# Patient Record
Sex: Male | Born: 1935 | Race: White | Hispanic: No | Marital: Married | Smoking: Former smoker
Health system: Southern US, Community
[De-identification: ages and names within clinical notes are randomized; demographics above are authoritative.]

## PROBLEM LIST (undated history)

## (undated) DIAGNOSIS — E785 Hyperlipidemia, unspecified: Secondary | ICD-10-CM

## (undated) DIAGNOSIS — I10 Essential (primary) hypertension: Secondary | ICD-10-CM

## (undated) DIAGNOSIS — Z972 Presence of dental prosthetic device (complete) (partial): Secondary | ICD-10-CM

## (undated) DIAGNOSIS — E119 Type 2 diabetes mellitus without complications: Secondary | ICD-10-CM

## (undated) DIAGNOSIS — Z974 Presence of external hearing-aid: Secondary | ICD-10-CM

## (undated) HISTORY — PX: HERNIA REPAIR: SHX51

---

## 1997-01-02 HISTORY — PX: CORONARY ARTERY BYPASS GRAFT: SHX141

## 2004-04-07 ENCOUNTER — Emergency Department: Payer: Self-pay | Admitting: Emergency Medicine

## 2004-05-16 ENCOUNTER — Ambulatory Visit: Payer: Self-pay | Admitting: Urology

## 2004-12-14 ENCOUNTER — Ambulatory Visit: Payer: Self-pay | Admitting: Cardiovascular Disease

## 2006-02-08 ENCOUNTER — Ambulatory Visit: Payer: Self-pay | Admitting: Gastroenterology

## 2008-05-13 ENCOUNTER — Ambulatory Visit: Payer: Self-pay | Admitting: Specialist

## 2009-05-28 ENCOUNTER — Ambulatory Visit: Payer: Self-pay | Admitting: General Surgery

## 2009-12-21 ENCOUNTER — Ambulatory Visit: Payer: Self-pay | Admitting: Vascular Surgery

## 2010-05-17 NOTE — Procedures (Signed)
CAROTID DUPLEX EXAM   INDICATION:  Calcifications in neck by x-ray.   HISTORY:  Diabetes:  No.  Cardiac:  CABG.  Hypertension:  No.  Smoking:  Previous.  Previous Surgery:  No.  CV History:  Currently asymptomatic.  Amaurosis Fugax No, Paresthesias No, Hemiparesis No                                       RIGHT             LEFT  Brachial systolic pressure:         108               102  Brachial Doppler waveforms:         Normal            Normal  Vertebral direction of flow:        Antegrade         Antegrade  DUPLEX VELOCITIES (cm/sec)  CCA peak systolic                   89                103  ECA peak systolic                   76                75  ICA peak systolic                   62                97  ICA end diastolic                   15                23  PLAQUE MORPHOLOGY:                  Heterogeneous     Heterogeneous  PLAQUE AMOUNT:                      Mild              Mild  PLAQUE LOCATION:                    ICA/ECA/CCA       ICA   IMPRESSION:  No hemodynamically stenoses of the bilateral internal  carotid arteries with mild plaque formations as described above.   ___________________________________________  Wesley Butler. Hart [place], M.D.   CH/MEDQ  D:  12/22/2009  T:  12/22/2009  Job:  [id number]

## 2010-05-17 NOTE — Consult Note (Signed)
NEW PATIENT CONSULTATION   Wesley Butler, Wesley Butler  DOB:  Feb 10, 1934                                       12/21/2009  CHART#:[id number]   The patient is a 76 year old male who needs some simple implant surgery  performed by Dr. Hyacinth Meeker.  X-rays revealed some calcification in his  carotid arteries and he was referred for further evaluation.  The  patient denies any history of stroke, TIAs, amaurosis fugax, diplopia,  blurred vision, syncope or any other neurologic symptoms.   CHRONIC MEDICAL PROBLEMS:  1. Coronary artery disease, previous coronary artery bypass grafting      1999.  2. Hypertension.  3. Hyperlipidemia.  4. Diabetes mellitus type 2.  5. History of renal calculi.  6. Negative for COPD or stroke.   SOCIAL HISTORY:  He is married, is retired.  Does not use tobacco.  Has  not for 20 years.  Does not use alcohol.   FAMILY HISTORY:  Positive for coronary artery disease in his brother and  father, diabetes in his father, TIAs in his mother as well as congestive  heart failure.   REVIEW OF SYSTEMS:  Positive for arthritis particularly in his neck,  occasional dyspnea on exertion.  No chest pain, no productive cough, no  history of pneumonia, atrial fibrillation, arrhythmias or other  symptoms.  All other symptoms are negative in review of systems.   PHYSICAL EXAM:  Vital signs:  Blood pressure 97/61, heart rate 58,  respirations 20.  General:  He is a well-developed, well-nourished male  who is in no apparent distress, alert and oriented x3.  HEENT:  Normal  for age.  EOMs intact.  Lungs:  Clear to auscultation.  No rhonchi or  wheezing.  Cardiovascular:  Regular rhythm.  No murmurs.  Carotid pulses  are 3+.  No audible bruits.  Abdomen:  Soft, nontender with no masses.  Musculoskeletal:  Free of major deformities.  Neurological:  Normal.  Skin:  Free of rashes.  Lower extremity exam reveals 3+ femoral,  popliteal and dorsalis pedis pulses bilaterally with  no edema.   Today I ordered a carotid duplex exam to completely evaluate the  patient's carotid system.  I reviewed and interpreted this.  He has no  evidence of any flow reduction in either internal carotid artery.  He  does have mild plaque formation.   This patient does not have significant carotid disease nor is he  symptomatic and any oral surgery contemplated could be performed without  concern for carotid disease.  He will return to see [place] on a p.r.n.  basis.     Wesley Butler, M.D.  Electronically Signed   JDL/MEDQ  D:  12/21/2009  T:  12/21/2009  Job:  [id number]   cc:   Hinton Dyer, D.D.S.

## 2010-06-26 IMAGING — CT CT CHEST W/ CM
1 series · 16 of 32 positions shown, 20 images · non-contrast
Comparison: none

REASON FOR EXAM: pulmonary nodules
COMMENTS:

[Series 2: soft tissue · axial · 0.77mm/px · z∈[-699,-444]mm · 16 of 57 slices shown, 20 images]
[im 4/57  soft-tissue]
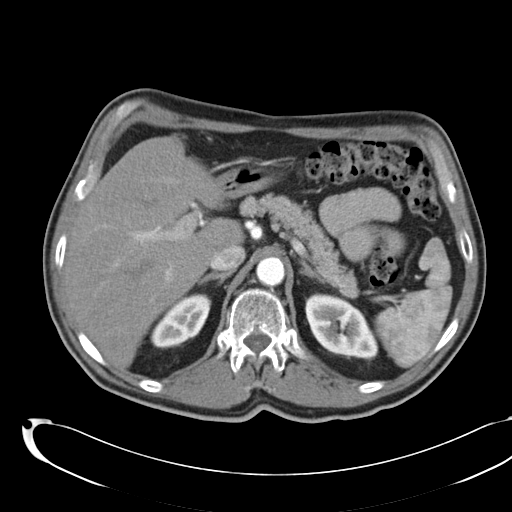
[im 4/57  bone]
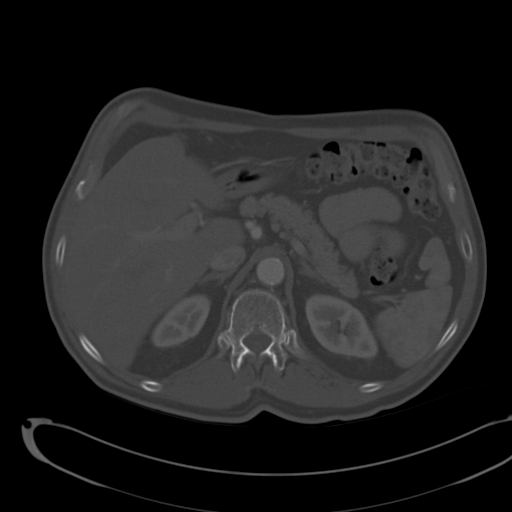
[im 8/57  soft-tissue]
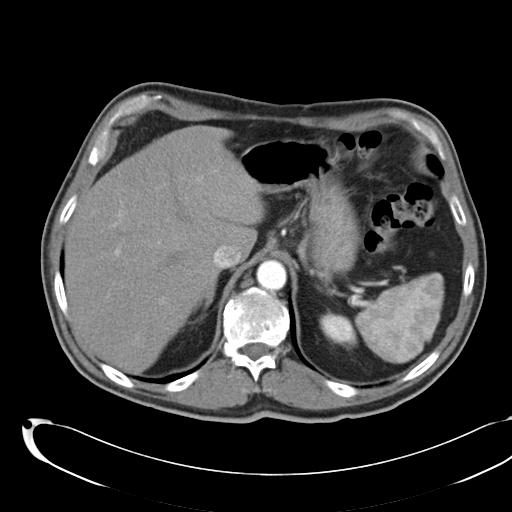
[im 11/57  soft-tissue]
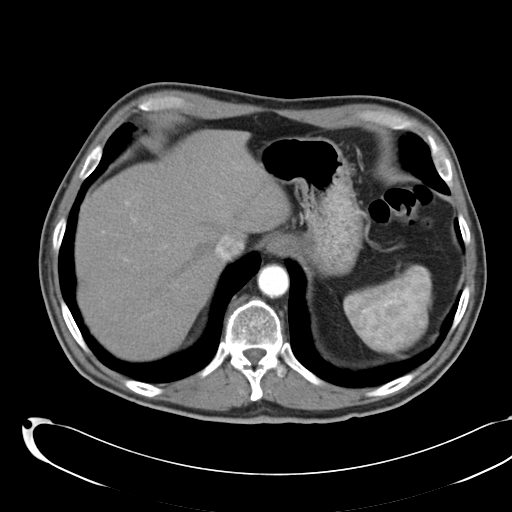
[im 15/57  soft-tissue]
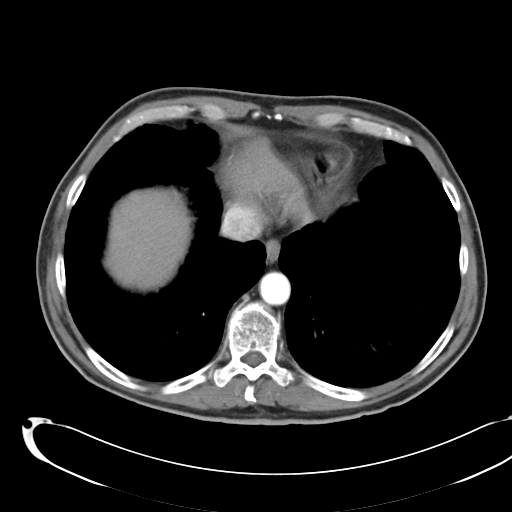
[im 19/57  soft-tissue]
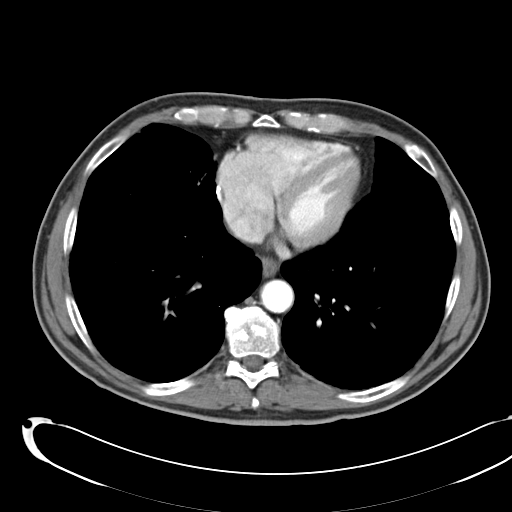
[im 22/57  soft-tissue]
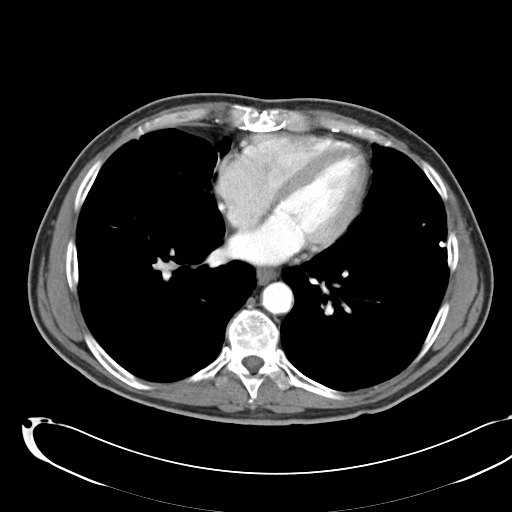
[im 26/57  soft-tissue]
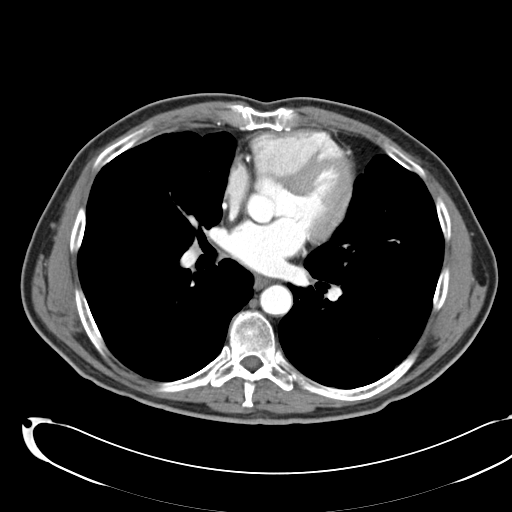
[im 31/57  soft-tissue]
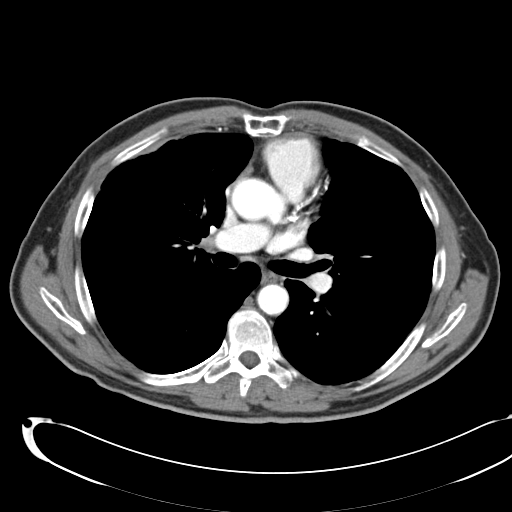
[im 35/57  soft-tissue]
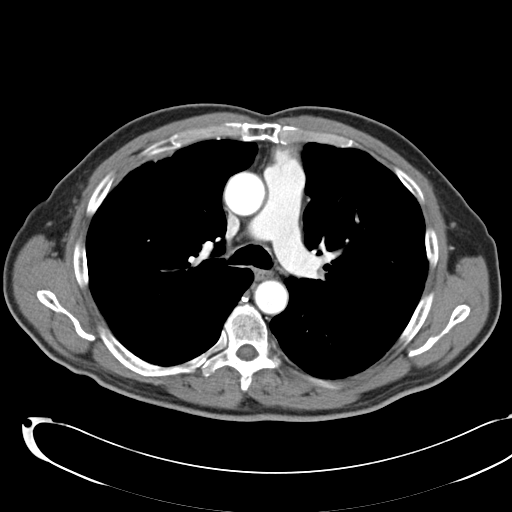
[im 35/57  bone]
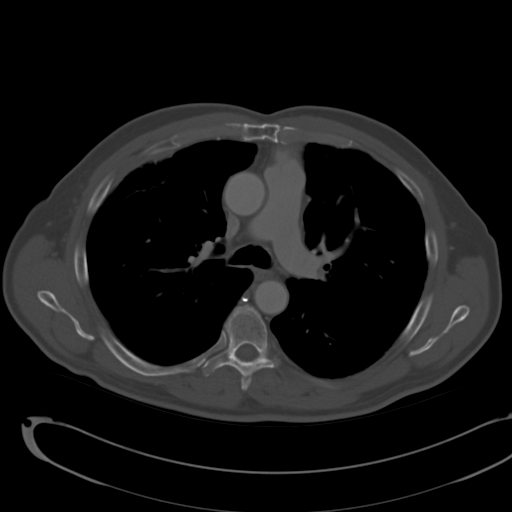
[im 38/57  soft-tissue]
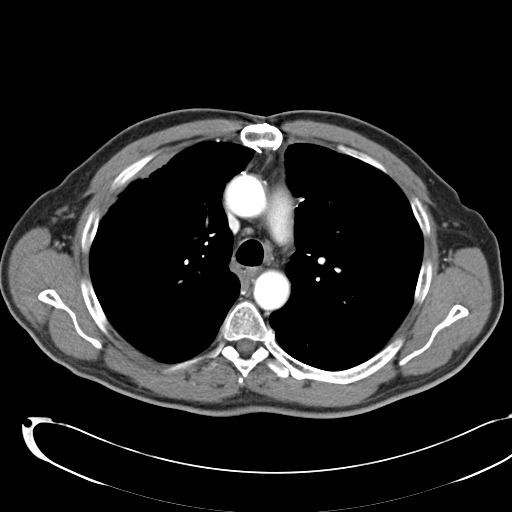
[im 42/57  soft-tissue]
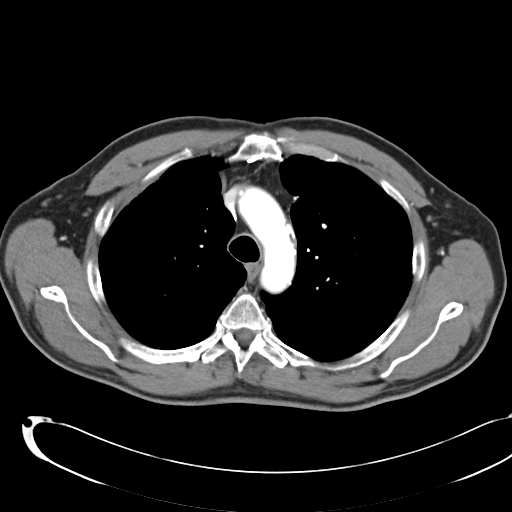
[im 46/57  soft-tissue]
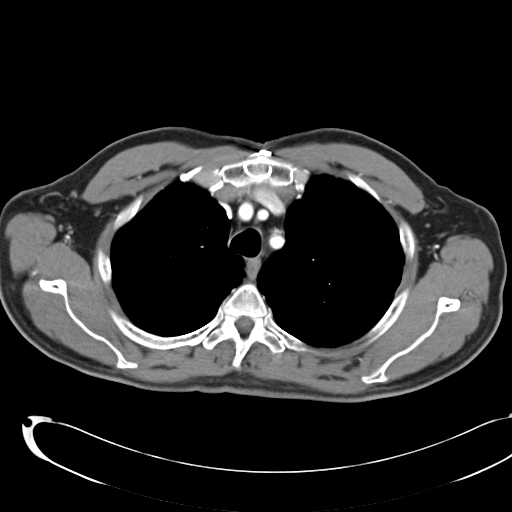
[im 49/57  soft-tissue]
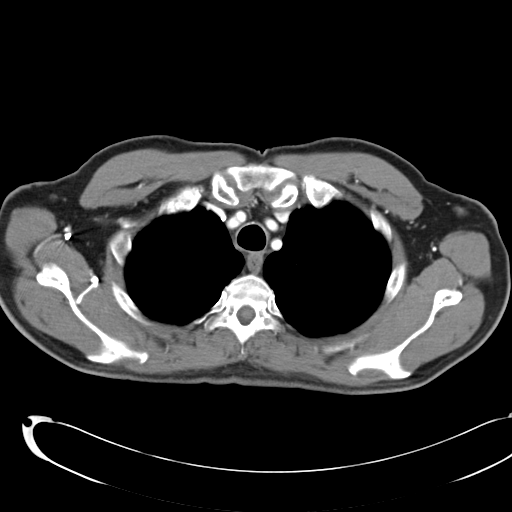
[im 49/57  lung]
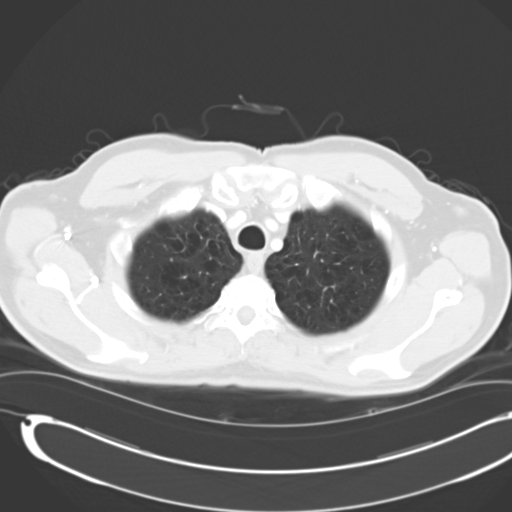
[im 51/57  lung]
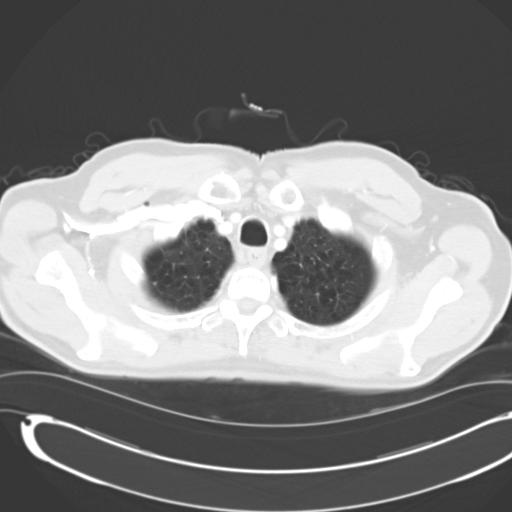
[im 53/57  soft-tissue]
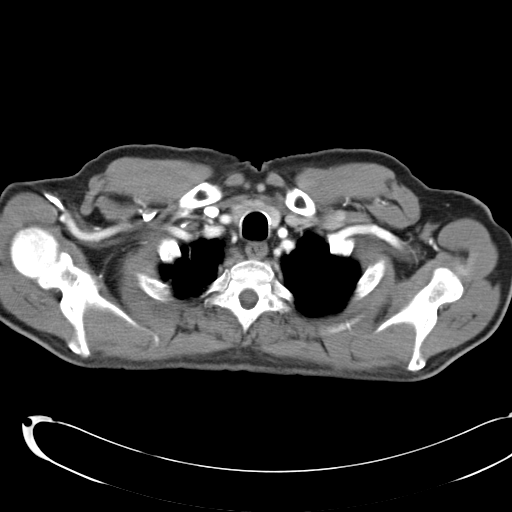
[im 53/57  lung]
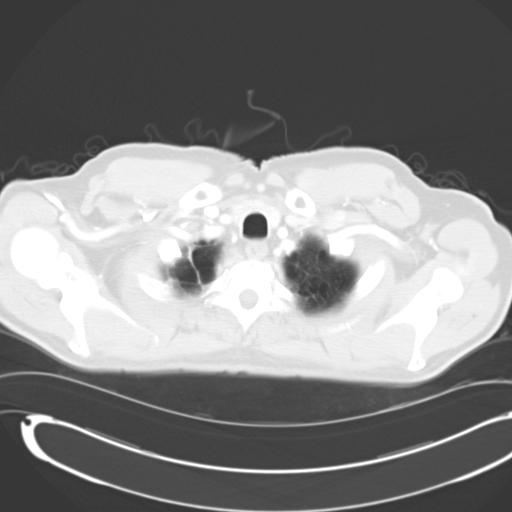
[im 55/57  lung]
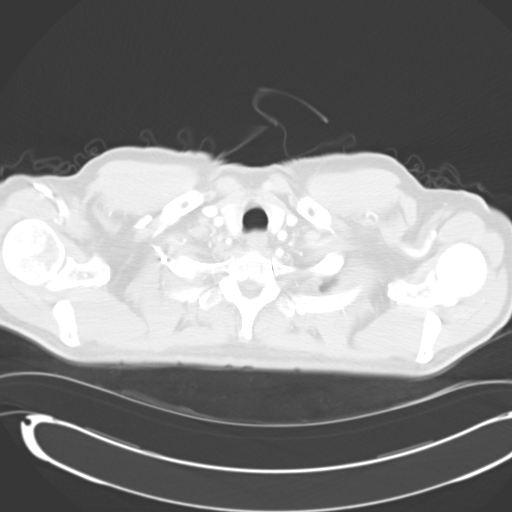

[16 of 32 positions shown; findings below may reference images not displayed]

PROCEDURE:     CT  - CT CHEST WITH CONTRAST  - May 13, 2008 [DATE]

RESULT:     CT of the chest is performed utilizing 75 ml of 9sovue-CID
iodinated intravenous contrast. Images are reconstructed in the axial plane
at 5 mm slice thickness. There is no previous exam for comparison.

Lung windows demonstrate extensive emphysematous changes. Some areas of
pleural thickening in the right upper lobe anteriorly and in the left upper
lobe where there is also some subpleural density suggestive of fibrosis. A
calcified subpleural nodular density is seen along the major fissure on the
left anteriorly. There is a no mediastinal or hilar mass or adenopathy. The
upper abdominal structures appear unremarkable.
IMPRESSION: 1. Fibrotic changes are present in the lungs without a definite pulmonary
mass.
2. Atherosclerotic changes are present.
3. Diffuse emphysematous lung disease is present.

## 2012-10-15 ENCOUNTER — Ambulatory Visit: Payer: Self-pay | Admitting: Gastroenterology

## 2012-10-16 LAB — PATHOLOGY REPORT

## 2014-03-31 ENCOUNTER — Ambulatory Visit
Admit: 2014-03-31 | Disposition: A | Discharge status: Home / Self Care | Payer: Self-pay | Attending: Oncology | Admitting: Oncology

## 2014-04-03 ENCOUNTER — Ambulatory Visit
Admit: 2014-04-03 | Disposition: A | Discharge status: Home / Self Care | Payer: Self-pay | Attending: Oncology | Admitting: Oncology

## 2017-02-28 ENCOUNTER — Encounter: Payer: Self-pay | Admitting: General Surgery

## 2021-08-19 ENCOUNTER — Emergency Department
Admission: EM | Admit: 2021-08-19 | Discharge: 2021-08-19 | Disposition: A | Discharge status: Home / Self Care | Payer: Medicare PPO | Attending: Emergency Medicine | Admitting: Emergency Medicine

## 2021-08-19 ENCOUNTER — Other Ambulatory Visit: Payer: Self-pay

## 2021-08-19 ENCOUNTER — Emergency Department: Payer: Medicare PPO

## 2021-08-19 ENCOUNTER — Encounter: Payer: Self-pay | Admitting: Emergency Medicine

## 2021-08-19 DIAGNOSIS — W312XXA Contact with powered woodworking and forming machines, initial encounter: Secondary | ICD-10-CM | POA: Insufficient documentation

## 2021-08-19 DIAGNOSIS — S62633A Displaced fracture of distal phalanx of left middle finger, initial encounter for closed fracture: Secondary | ICD-10-CM | POA: Diagnosis not present

## 2021-08-19 DIAGNOSIS — S62639B Displaced fracture of distal phalanx of unspecified finger, initial encounter for open fracture: Secondary | ICD-10-CM

## 2021-08-19 DIAGNOSIS — S6992XA Unspecified injury of left wrist, hand and finger(s), initial encounter: Secondary | ICD-10-CM | POA: Diagnosis present

## 2021-08-19 HISTORY — DX: Type 2 diabetes mellitus without complications: E11.9

## 2021-08-19 HISTORY — DX: Essential (primary) hypertension: I10

## 2021-08-19 HISTORY — DX: Hyperlipidemia, unspecified: E78.5

## 2021-08-19 MED ORDER — CEPHALEXIN 500 MG PO CAPS: 500.0000 mg | ORAL_CAPSULE | Freq: Four times a day (QID) | ORAL | 0 refills | Status: AC

## 2021-08-19 MED ORDER — CEFAZOLIN SODIUM-DEXTROSE 2-4 GM/100ML-% IV SOLN
2.0000 g | Freq: Once | INTRAVENOUS | Status: AC
  Administered 2021-08-19: 2 g via INTRAVENOUS
  Filled 2021-08-19: qty 100

## 2021-08-19 MED ORDER — LIDOCAINE HCL (PF) 1 % IJ SOLN
5.0000 mL | Freq: Once | INTRAMUSCULAR | Status: AC
  Administered 2021-08-19: 5 mL
  Filled 2021-08-19: qty 5

## 2021-08-19 NOTE — ED Provider Notes (Signed)
[facility] Provider Note    Event Date/Time   First MD Initiated Contact with Patient 08/19/21 1437     (approximate)   History   Finger Injury   HPI  Wesley Butler is a 87 y.o. male who presents today for evaluation of left middle finger and chest injury.  Patient reports that he was cutting wood with a table saw and a block of blood was injected from the table saw striking him on the left middle finger and then in the ribs and chest.  He went to [place] presented to the emergency department for evaluation.  He denies numbness or tingling.  He denies any pain in his chest or shortness of breath.  No wounds.  He reports that his tetanus shot is up-to-date.  Patient is very upset that he was waiting in the waiting room for over 1 hour which he said was 3 hours.  He reports that there is "no excuse" for the temperature of the hospital.   There are no problems to display for this patient.         Physical Exam   Triage Vital Signs: ED Triage Vitals  Enc Vitals Group     BP 08/19/21 1318 (!) 109/58     Pulse Rate 08/19/21 1318 62     Resp 08/19/21 1318 18     Temp 08/19/21 1318 97.8 F (36.6 C)     Temp Source 08/19/21 1318 Oral     SpO2 08/19/21 1318 98 %     Weight 08/19/21 1320 125 lb (56.7 kg)     Height 08/19/21 1320 '5\' 6"'$  (1.676 m)     Head Circumference --      Peak Flow --      Pain Score 08/19/21 1318 4     Pain Loc --      Pain Edu? --      Excl. in [place]? --     Most recent vital signs: Vitals:   08/19/21 1318  BP: (!) 109/58  Pulse: 62  Resp: 18  Temp: 97.8 F (36.6 C)  SpO2: 98%    Physical Exam Vitals and nursing note reviewed.  Constitutional:      General: Awake and alert. No acute distress.    Appearance: Normal appearance. The patient is normal weight.  HENT:     Head: Normocephalic and atraumatic.     Mouth: Mucous membranes are moist.  Eyes:     General: PERRL. Normal EOMs        Right eye: No  discharge.        Left eye: No discharge.     Conjunctiva/sclera: Conjunctivae normal.  Cardiovascular:     Rate and Rhythm: Normal rate and regular rhythm.     Pulses: Normal pulses.     Heart sounds: Normal heart sounds Pulmonary:     Effort: Pulmonary effort is normal. No respiratory distress.     Breath sounds: Normal breath sounds.  No chest wall tenderness or ecchymosis Abdominal:     Abdomen is soft. There is no abdominal tenderness. No rebound or guarding. No distention. Musculoskeletal:        General: No swelling. Normal range of motion.     Cervical back: Normal range of motion and neck supple.  Third finger of left hand with chunk of skin and nail removed from the dorsum of the finger.  There is a small laceration to the ulnar side, using.  Remainder of nail  is still firmly embedded.  Patient is able to flex and extend at isolated PIP and DIP against resistance.  Sensation intact light touch throughout.  No swelling or ecchymosis noted. Skin:    General: Skin is warm and dry.     Capillary Refill: Capillary refill takes less than 2 seconds.     Findings: No rash.  Neurological:     Mental Status: The patient is awake and alert.      ED Results / Procedures / Treatments   Labs (all labs ordered are listed, but only abnormal results are displayed) Labs Reviewed - No data to display   EKG     RADIOLOGY I independently reviewed and interpreted imaging and agree with radiologists findings.     PROCEDURES:  Critical Care performed:   Marland KitchenMarland KitchenLaceration Repair  Date/Time: 08/19/2021 6:17 PM  Performed by: Marquette Old, PA-C Authorized by: Marquette Old, PA-C   Consent:    Consent obtained:  Verbal   Consent given by:  Patient   Risks, benefits, and alternatives were discussed: yes     Risks discussed:  Infection, need for additional repair, nerve damage, poor wound healing, poor cosmetic result, pain, retained foreign body, tendon damage and vascular damage    Alternatives discussed:  No treatment Universal protocol:    Procedure explained and questions answered to patient or proxy's satisfaction: yes     Relevant documents present and verified: yes     Test results available: yes     Imaging studies available: yes     Required blood products, implants, devices, and special equipment available: yes     Site/side marked: yes     Immediately prior to procedure, a time out was called: yes     Patient identity confirmed:  Verbally with patient Anesthesia:    Anesthesia method:  Nerve block   Block anesthetic:  Lidocaine 1% w/o epi   Block injection procedure:  Anatomic landmarks identified, anatomic landmarks palpated, introduced needle, negative aspiration for blood and incremental injection   Block outcome:  Anesthesia achieved Laceration details:    Location:  Finger   Finger location:  L long finger   Length (cm):  1.5   Depth (mm):  4 Pre-procedure details:    Preparation:  Patient was prepped and draped in usual sterile fashion and imaging obtained to evaluate for foreign bodies Exploration:    Imaging outcome: foreign body not noted     Wound exploration: wound explored through full range of motion and entire depth of wound visualized     Wound extent: underlying fracture     Wound extent: no foreign bodies/material noted     Contaminated: no   Treatment:    Area cleansed with:  Saline   Amount of cleaning:  Extensive   Irrigation solution:  Sterile saline   Irrigation method:  Syringe   Debridement:  None   Undermining:  None   Scar revision: no   Skin repair:    Repair method:  Sutures   Suture size:  5-0   Suture material:  Nylon   Suture technique:  Simple interrupted Approximation:    Approximation:  Loose Repair type:    Repair type:  Simple Post-procedure details:    Dressing:  Non-adherent dressing, splint for protection, sterile dressing and tube gauze   Procedure completion:  Tolerated well, no immediate  complications    MEDICATIONS ORDERED IN ED: Medications  lidocaine (PF) (XYLOCAINE) 1 % injection 5 mL (5 mLs Infiltration Given  08/19/21 1453)  ceFAZolin (ANCEF) IVPB 2g/100 mL premix (0 g Intravenous Stopped 08/19/21 1703)     IMPRESSION / MDM / ASSESSMENT AND PLAN / ED COURSE  I reviewed the triage vital signs and the nursing notes.   Differential diagnosis includes, but is not limited to, laceration, open fracture, contusion.  Patient is awake and alert, neurovascularly intact.  He has a chunk of skin and nail missing so there is nothing to repair there.  There is a small laceration along the side of his finger which appears to be more superficial but is repairable.  He initially declined x-rays but when I discussed that any to ensure that there is no open fracture he agreed.  He declines tetanus vaccination.  He does not have any chest pain.  There was no obvious injuries noted to his chest wall and x-ray was negative for any acute abnormalities.  He has no tenderness to his anterior chest wall.  X-ray hand reveals a lucency in the tip of the distal phalanx of the left middle finger.  He was given a dose of Ancef in the emergency department before his finger was irrigated extensively.  Bleeding was stopped with several stitches, however skin is quite macerated and with a chunk of skin and nail missing this is a difficult repair.  He was placed in a nonadhesive dressing and a finger splint.  He was instructed to follow-up with orthopedics and the appropriate information was provided. He understands that there will be a deformity there. I advised him to follow-up on Monday.  He was started on antibiotics in the meantime.  We discussed wound care and timeline for suture removal.  Patient understands and agrees with plan.  He was discharged with his wife.  Patient's presentation is most consistent with acute complicated illness / injury requiring diagnostic workup.    FINAL CLINICAL  IMPRESSION(S) / ED DIAGNOSES   Final diagnoses:  Open avulsion fracture of distal phalanx of finger, initial encounter     Rx / DC Orders   ED Discharge Orders          Ordered    cephALEXin (KEFLEX) 500 MG capsule  4 times daily        08/19/21 1613             Note:  This document was prepared using Dragon voice recognition software and may include unintentional dictation errors.   Emeline Gins 08/19/21 Cira Rue, MD 08/19/21 1949

## 2021-08-19 NOTE — Discharge Instructions (Signed)
You have an open fracture of your finger.  Take the antibiotics as prescribed.  Return if your gauze is visibly dirty or soaked.  Please return for any new, worsening, or change in symptoms or other concerns.  Please call the orthopedic office today to arrange appointment for Monday. Keep the area clean and dry.  Do not go into the ocean or swimming pool.  Your sutures will need to be removed in 7 days.  Return to the emergency department for:  -- Fever > 100.31F -- Increase pain in the wound -- Increase redness and swelling -- Pus coming from the wound -- Wound bleeds more than a small amount or it does not stop -- Wound edges come apart -- Severe pain -- Weakness or numbness in the affected area  Or any other new or worsening symptoms. It was a pleasure caring for you.

## 2021-08-19 NOTE — ED Triage Notes (Signed)
Patient sent over from [facility] for left middle finger laceration. Patient states he cut himself with a table saw.

## 2021-08-19 NOTE — ED Notes (Signed)
See triage note  Presents with laceration to left index finger from table saw

## 2021-08-23 DIAGNOSIS — I2510 Atherosclerotic heart disease of native coronary artery without angina pectoris: Secondary | ICD-10-CM | POA: Insufficient documentation

## 2021-08-23 DIAGNOSIS — I10 Essential (primary) hypertension: Secondary | ICD-10-CM | POA: Insufficient documentation

## 2021-08-23 DIAGNOSIS — S92011S Displaced fracture of body of right calcaneus, sequela: Secondary | ICD-10-CM | POA: Insufficient documentation

## 2021-08-23 DIAGNOSIS — E785 Hyperlipidemia, unspecified: Secondary | ICD-10-CM | POA: Insufficient documentation

## 2021-11-03 ENCOUNTER — Encounter: Payer: Self-pay | Admitting: Gastroenterology

## 2021-11-03 ENCOUNTER — Ambulatory Visit (INDEPENDENT_AMBULATORY_CARE_PROVIDER_SITE_OTHER): Payer: No Typology Code available for payment source | Admitting: Gastroenterology

## 2021-11-03 VITALS — BP 112/74 | HR 69 | Temp 98.0°F | Ht 66.0 in | Wt 130.0 lb

## 2021-11-03 DIAGNOSIS — R634 Abnormal weight loss: Secondary | ICD-10-CM | POA: Diagnosis not present

## 2021-11-03 DIAGNOSIS — R197 Diarrhea, unspecified: Secondary | ICD-10-CM | POA: Diagnosis not present

## 2021-11-03 DIAGNOSIS — E1169 Type 2 diabetes mellitus with other specified complication: Secondary | ICD-10-CM | POA: Insufficient documentation

## 2021-11-03 MED ORDER — NA SULFATE-K SULFATE-MG SULF 17.5-3.13-1.6 GM/177ML PO SOLN: 1.0000 | Freq: Once | ORAL | 0 refills | Status: AC

## 2021-11-03 NOTE — Progress Notes (Signed)
    Gastroenterology Consultation  Referring Provider:     Desiree Hane, PA Primary Care Physician:  System, Provider Not In Primary Gastroenterologist:  Dr. Allen Norris     Reason for Consultation:     Diarrhea        HPI:   Wesley Butler is a 87 y.o. y/o male referred for consultation & management of diarrhea by Dr. Estanislado Spire, Provider Not In.  This patient comes today with a referral from the [place].  The patient has had diarrhea that he is being sent to me for evaluation for.  The patient had reported that he had a brother who had colon cancer in his 41s.  The patient has a history of colon polyps back in 2014 with a colonoscopy by Dr. Rayann Heman. He says he lost 20 lbs in the last year. He denies any rectal bleeding with the weight loss.  He is also reporting that his stools have been loose most the time.  The patient also reports that his last colonoscopy and the previous one before that had polyps to the best of his recollection.  Past Medical History:  Diagnosis Date   Diabetes mellitus without complication ([place])    Hyperlipidemia    Hypertension     Past Surgical History:  Procedure Laterality Date   HERNIA REPAIR      Prior to Admission medications   Not on File    No family history on file.   Social History   Tobacco Use   Smoking status: Former    Types: Cigarettes   Smokeless tobacco: Never    Allergies as of 11/03/2021   (No Known Allergies)    Review of Systems:    All systems reviewed and negative except where noted in HPI.   Physical Exam:  There were no vitals taken for this visit. No LMP for male patient. General:   Alert,  Well-developed, well-nourished, pleasant and cooperative in NAD Head:  Normocephalic and atraumatic. Eyes:  Sclera clear, no icterus.   Conjunctiva pink. Ears:  Normal auditory acuity. Neck:  Supple; no masses or thyromegaly. Neurologic:  Alert and oriented x3;  grossly normal neurologically. Psych:  Alert and cooperative. Normal mood and  affect.  Imaging Studies: No results found.  Assessment and Plan:   Wesley Butler is a 87 y.o. y/o male who comes in today with a history of a brother with colon cancer in his 33s.  The patient has also had polyps in his past.  The patient has now had a weight loss of 20 pounds in the last year with diarrhea.  The patient will be set up for colonoscopy to look for any colon pathology as a cause of his reported symptoms.  The patient has also been told that he can take liquid Imodium and titrated to effect so that he can make his stools harder.  The patient has been explained the plan and agrees with it.    Lucilla Lame, MD. Marval Regal    Note: This dictation was prepared with Dragon dictation along with smaller phrase technology. Any transcriptional errors that result from this process are unintentional.

## 2021-11-03 NOTE — H&P (View-Only) (Signed)
    Gastroenterology Consultation  Referring Provider:     Desiree Hane, PA Primary Care Physician:  System, Provider Not In Primary Gastroenterologist:  Dr. Allen Norris     Reason for Consultation:     Diarrhea        HPI:   JARON CZARNECKI is a 87 y.o. y/o male referred for consultation & management of diarrhea by Dr. Estanislado Spire, Provider Not In.  This patient comes today with a referral from the [place].  The patient has had diarrhea that he is being sent to me for evaluation for.  The patient had reported that he had a brother who had colon cancer in his 29s.  The patient has a history of colon polyps back in 2014 with a colonoscopy by Dr. Rayann Heman. He says he lost 20 lbs in the last year. He denies any rectal bleeding with the weight loss.  He is also reporting that his stools have been loose most the time.  The patient also reports that his last colonoscopy and the previous one before that had polyps to the best of his recollection.  Past Medical History:  Diagnosis Date   Diabetes mellitus without complication ([place])    Hyperlipidemia    Hypertension     Past Surgical History:  Procedure Laterality Date   HERNIA REPAIR      Prior to Admission medications   Not on File    No family history on file.   Social History   Tobacco Use   Smoking status: Former    Types: Cigarettes   Smokeless tobacco: Never    Allergies as of 11/03/2021   (No Known Allergies)    Review of Systems:    All systems reviewed and negative except where noted in HPI.   Physical Exam:  There were no vitals taken for this visit. No LMP for male patient. General:   Alert,  Well-developed, well-nourished, pleasant and cooperative in NAD Head:  Normocephalic and atraumatic. Eyes:  Sclera clear, no icterus.   Conjunctiva pink. Ears:  Normal auditory acuity. Neck:  Supple; no masses or thyromegaly. Neurologic:  Alert and oriented x3;  grossly normal neurologically. Psych:  Alert and cooperative. Normal mood and  affect.  Imaging Studies: No results found.  Assessment and Plan:   LETICIA MCDIARMID is a 87 y.o. y/o male who comes in today with a history of a brother with colon cancer in his 39s.  The patient has also had polyps in his past.  The patient has now had a weight loss of 20 pounds in the last year with diarrhea.  The patient will be set up for colonoscopy to look for any colon pathology as a cause of his reported symptoms.  The patient has also been told that he can take liquid Imodium and titrated to effect so that he can make his stools harder.  The patient has been explained the plan and agrees with it.    Lucilla Lame, MD. Marval Regal    Note: This dictation was prepared with Dragon dictation along with smaller phrase technology. Any transcriptional errors that result from this process are unintentional.

## 2021-11-08 ENCOUNTER — Encounter: Payer: Self-pay | Admitting: Gastroenterology

## 2021-11-09 NOTE — Addendum Note (Signed)
Addended by: Lurlean Nanny on: 11/09/2021 04:24 PM   Modules accepted: Orders

## 2021-11-14 ENCOUNTER — Encounter: Payer: Self-pay | Admitting: Gastroenterology

## 2021-11-15 ENCOUNTER — Encounter: Payer: Self-pay | Admitting: Gastroenterology

## 2021-11-15 ENCOUNTER — Telehealth: Payer: Self-pay

## 2021-11-15 MED ORDER — PEG 3350-KCL-NABCB-NACL-NASULF 236 G PO SOLR: 4000.0000 mL | Freq: Once | ORAL | 0 refills | Status: AC

## 2021-11-15 NOTE — Telephone Encounter (Signed)
Msg left on VM from [facility] requesting alternative prep to be sent in. Rx for Golytely sent to replace Suprep

## 2021-11-15 NOTE — Addendum Note (Signed)
Addended by: Lurlean Nanny on: 11/15/2021 05:17 PM   Modules accepted: Orders

## 2021-11-16 MED ORDER — NA SULFATE-K SULFATE-MG SULF 17.5-3.13-1.6 GM/177ML PO SOLN: 1.0000 | Freq: Once | ORAL | 0 refills | Status: AC

## 2021-11-16 NOTE — Telephone Encounter (Signed)
Pt is aware of the Rx change but states he would [facility] to [facility] to see if they will cover the more expensive prep  He will call me back to confirm

## 2021-11-16 NOTE — Telephone Encounter (Signed)
Wife asked to send Suprep to [facility] as the [place] should pick up the cost... Rx sent through e-scribe

## 2021-11-16 NOTE — Addendum Note (Signed)
Addended by: Lurlean Nanny on: 11/16/2021 03:09 PM   Modules accepted: Orders

## 2021-11-21 ENCOUNTER — Encounter
Admission: RE | Disposition: A | Discharge status: Home / Self Care | Payer: Self-pay | Source: Home / Self Care | Attending: Gastroenterology

## 2021-11-21 ENCOUNTER — Ambulatory Visit
Admission: RE | Admit: 2021-11-21 | Discharge: 2021-11-21 | Disposition: A | Discharge status: Home / Self Care | Payer: No Typology Code available for payment source | Attending: Gastroenterology | Admitting: Gastroenterology

## 2021-11-21 ENCOUNTER — Ambulatory Visit: Payer: No Typology Code available for payment source | Admitting: Anesthesiology

## 2021-11-21 ENCOUNTER — Other Ambulatory Visit: Payer: Self-pay

## 2021-11-21 DIAGNOSIS — K5730 Diverticulosis of large intestine without perforation or abscess without bleeding: Secondary | ICD-10-CM | POA: Insufficient documentation

## 2021-11-21 DIAGNOSIS — R197 Diarrhea, unspecified: Secondary | ICD-10-CM | POA: Diagnosis not present

## 2021-11-21 DIAGNOSIS — K641 Second degree hemorrhoids: Secondary | ICD-10-CM | POA: Insufficient documentation

## 2021-11-21 DIAGNOSIS — I10 Essential (primary) hypertension: Secondary | ICD-10-CM | POA: Diagnosis not present

## 2021-11-21 DIAGNOSIS — E119 Type 2 diabetes mellitus without complications: Secondary | ICD-10-CM | POA: Diagnosis not present

## 2021-11-21 DIAGNOSIS — Z79899 Other long term (current) drug therapy: Secondary | ICD-10-CM | POA: Insufficient documentation

## 2021-11-21 DIAGNOSIS — D124 Benign neoplasm of descending colon: Secondary | ICD-10-CM | POA: Insufficient documentation

## 2021-11-21 DIAGNOSIS — E785 Hyperlipidemia, unspecified: Secondary | ICD-10-CM | POA: Diagnosis not present

## 2021-11-21 DIAGNOSIS — K529 Noninfective gastroenteritis and colitis, unspecified: Secondary | ICD-10-CM | POA: Diagnosis present

## 2021-11-21 DIAGNOSIS — Z87891 Personal history of nicotine dependence: Secondary | ICD-10-CM | POA: Diagnosis not present

## 2021-11-21 DIAGNOSIS — K635 Polyp of colon: Secondary | ICD-10-CM | POA: Diagnosis not present

## 2021-11-21 DIAGNOSIS — I2510 Atherosclerotic heart disease of native coronary artery without angina pectoris: Secondary | ICD-10-CM | POA: Insufficient documentation

## 2021-11-21 DIAGNOSIS — R634 Abnormal weight loss: Secondary | ICD-10-CM

## 2021-11-21 DIAGNOSIS — Z951 Presence of aortocoronary bypass graft: Secondary | ICD-10-CM | POA: Insufficient documentation

## 2021-11-21 HISTORY — DX: Presence of dental prosthetic device (complete) (partial): Z97.2

## 2021-11-21 HISTORY — PX: COLONOSCOPY WITH PROPOFOL: SHX5780

## 2021-11-21 HISTORY — DX: Presence of external hearing-aid: Z97.4

## 2021-11-21 LAB — GLUCOSE, CAPILLARY: Glucose-Capillary: 151 mg/dL — ABNORMAL HIGH (ref 70–99)

## 2021-11-21 SURGERY — COLONOSCOPY WITH PROPOFOL
Anesthesia: General

## 2021-11-21 MED ORDER — STERILE WATER FOR IRRIGATION IR SOLN
Status: DC | PRN
  Administered 2021-11-21: 1000 mL

## 2021-11-21 MED ORDER — LIDOCAINE HCL (CARDIAC) PF 100 MG/5ML IV SOSY
PREFILLED_SYRINGE | INTRAVENOUS | Status: DC | PRN
  Administered 2021-11-21: 40 mg via INTRAVENOUS

## 2021-11-21 MED ORDER — PHENYLEPHRINE HCL (PRESSORS) 10 MG/ML IV SOLN
INTRAVENOUS | Status: DC | PRN
  Administered 2021-11-21: 80 ug via INTRAVENOUS
  Administered 2021-11-21: 50 ug via INTRAVENOUS

## 2021-11-21 MED ORDER — LACTATED RINGERS IV SOLN: INTRAVENOUS | Status: DC

## 2021-11-21 MED ORDER — SODIUM CHLORIDE 0.9 % IV SOLN: INTRAVENOUS | Status: DC

## 2021-11-21 MED ORDER — PROPOFOL 10 MG/ML IV BOLUS
INTRAVENOUS | Status: DC | PRN
  Administered 2021-11-21: 50 mg via INTRAVENOUS
  Administered 2021-11-21: 20 mg via INTRAVENOUS

## 2021-11-21 SURGICAL SUPPLY — 21 items

## 2021-11-21 NOTE — Interval H&P Note (Signed)
Lucilla Lame, MD [address] [place],  [address] Phone:[phone] Fax : [phone]  Primary Care Physician:  System, Provider Not In Primary Gastroenterologist:  Dr. Allen Norris  Pre-Procedure History & Physical: HPI:  Wesley Butler is a 87 y.o. male is here for an colonoscopy.   Past Medical History:  Diagnosis Date   Diabetes mellitus without complication ([place])    Hyperlipidemia    Hypertension    Wears dentures    full upper and lower   Wears hearing aid in both ears    Has, doesn't wear    Past Surgical History:  Procedure Laterality Date   CORONARY ARTERY BYPASS GRAFT  1999   3 vessel   HERNIA REPAIR      Prior to Admission medications   Medication Sig Start Date End Date Taking? Authorizing Provider  Alogliptin Benzoate 25 MG TABS TAKE ONE TABLET BY MOUTH ONCE EVERY DAY **REPLACES SAXAGLIPTIN** 07/25/21  Yes [provider]  ASPIRIN 81 PO Take by mouth.   Yes [provider]  atorvastatin (LIPITOR) 80 MG tablet TAKE ONE TABLET BY MOUTH AT BEDTIME FOR CHOLESTEROL 01/24/21  Yes [provider]  Calcium Carb-Cholecalciferol 600-10 MG-MCG TABS TAKE 1 TABLET BY MOUTH ONCE EVERY DAY 07/25/21  Yes [provider]  ezetimibe (ZETIA) 10 MG tablet Take 1 tablet by mouth daily. 06/07/21  Yes [provider]  isosorbide mononitrate (IMDUR) 30 MG 24 hr tablet TAKE 1/2 TABLET BY MOUTH ONCE EVERY DAY TO PREVENT CHEST PAIN FOR HEART (DO NOT TAKE CIALIS, VIAGRA OR LEVITRA WHILE YOU ARE ON THIS MEDICATION). 07/25/21  Yes [provider]  lisinopril (ZESTRIL) 20 MG tablet TAKE ONE-HALF TABLET BY MOUTH EVERY DAY FOR BLOOD PRESSURE 01/24/21  Yes [provider]  metFORMIN (GLUCOPHAGE) 500 MG tablet Take 1,000 mg by mouth daily with breakfast. 02/17/21  Yes [provider]  Multiple Vitamins-Minerals (ONCOVITE) TABS Take 1 tablet by mouth daily.   Yes [provider]  Omega-3 Fatty Acids (FISH  OIL) 1000 MG CAPS Take by mouth.   Yes [provider]    Allergies as of 11/09/2021   (No Known Allergies)    History reviewed. No pertinent family history.  Social History   Socioeconomic History   Marital status: Married    Spouse name: Not on file   Number of children: Not on file   Years of education: Not on file   Highest education level: Not on file  Occupational History   Not on file  Tobacco Use   Smoking status: Former    Types: Cigarettes    Quit date: 10    Years since quitting: 38.9   Smokeless tobacco: Never  Vaping Use   Vaping Use: Never used  Substance and Sexual Activity   Alcohol use: Yes    Comment: Rare   Drug use: Not on file   Sexual activity: Not on file  Other Topics Concern   Not on file  Social History Narrative   Not on file   Social Determinants of Health   Financial Resource Strain: Not on file  Food Insecurity: Not on file  Transportation Needs: Not on file  Physical Activity: Not on file  Stress: Not on file  Social Connections: Not on file  Intimate Partner Violence: Not on file    Review of Systems: See HPI, otherwise negative ROS  Physical Exam: Ht '5\' 6"'$  (1.676 m)   Wt 56.7 kg   BMI 20.18 kg/m  General:   Alert,  pleasant and cooperative in NAD Head:  Normocephalic and atraumatic. Neck:  Supple; no masses or thyromegaly. Lungs:  Clear throughout to auscultation.    Heart:  Regular rate and rhythm. Abdomen:  Soft, nontender and nondistended. Normal bowel sounds, without guarding, and without rebound.   Neurologic:  Alert and  oriented x4;  grossly normal neurologically.  Impression/Plan: Wesley Butler is here for an colonoscopy to be performed for change inb bowel habits   Risks, benefits, limitations, and alternatives regarding  colonoscopy have been reviewed with the patient.  Questions have been answered.  All parties agreeable.   Lucilla Lame, MD  11/21/2021, 7:59 AM

## 2021-11-21 NOTE — Op Note (Signed)
[facility] Gastroenterology Patient Name: Wesley Butler Procedure Date: 11/21/2021 8:27 AM MRN: [id number] Account #: [id number] Date of Birth: 04-05-1934 Admit Type: Outpatient Age: 87 Room: [facility] OR ROOM 01 Gender: Male Note Status: Finalized Instrument Name: [id number] Procedure:             Colonoscopy Indications:           Chronic diarrhea Providers:             Lucilla Lame MD, MD Medicines:             Propofol per Anesthesia Complications:         No immediate complications. Procedure:             Pre-Anesthesia Assessment:                        - Prior to the procedure, a History and Physical was                         performed, and patient medications and allergies were                         reviewed. The patient's tolerance of previous                         anesthesia was also reviewed. The risks and benefits                         of the procedure and the sedation options and risks                         were discussed with the patient. All questions were                         answered, and informed consent was obtained. Prior                         Anticoagulants: The patient has taken no anticoagulant                         or antiplatelet agents. ASA Grade Assessment: II - A                         patient with mild systemic disease. After reviewing                         the risks and benefits, the patient was deemed in                         satisfactory condition to undergo the procedure.                        After obtaining informed consent, the colonoscope was                         passed under direct vision. Throughout the procedure,                         the patient's blood pressure, pulse, and oxygen  saturations were monitored continuously. The was                         introduced through the anus and advanced to the the                         cecum, identified by appendiceal orifice and  ileocecal                         valve. The colonoscopy was performed without                         difficulty. The patient tolerated the procedure well.                         The quality of the bowel preparation was excellent. Findings:      The perianal and digital rectal examinations were normal.      A 7 mm polyp was found in the descending colon. The polyp was sessile.       The polyp was removed with a cold snare. Resection and retrieval were       complete.      Multiple small-mouthed diverticula were found in the entire colon.      Non-bleeding internal hemorrhoids were found. The hemorrhoids were Grade       II (internal hemorrhoids that prolapse but reduce spontaneously).      Random biopsies were obtained with cold forceps for histology randomly       in the entire colon. Impression:            - One 7 mm polyp in the descending colon, removed with                         a cold snare. Resected and retrieved.                        - Diverticulosis in the entire examined colon.                        - Non-bleeding internal hemorrhoids.                        - Random biopsies were obtained in the entire colon. Recommendation:        - Discharge patient to home.                        - Resume previous diet.                        - Continue present medications.                        - Await pathology results. Procedure Code(s):     --- Professional ---                        [phone], Colonoscopy, flexible; with removal of                         tumor(s), polyp(s), or other lesion(s) by snare  technique                        45380, 59, Colonoscopy, flexible; with biopsy, single                         or multiple Diagnosis Code(s):     --- Professional ---                        K52.9, Noninfective gastroenteritis and colitis,                         unspecified                        D12.4, Benign neoplasm of descending colon CPT copyright  2022 American Medical Association. All rights reserved. The codes documented in this report are preliminary and upon coder review may  be revised to meet current compliance requirements. Lucilla Lame MD, MD 11/21/2021 8:49:44 AM This report has been signed electronically. Number of Addenda: 0 Note Initiated On: 11/21/2021 8:27 AM Scope Withdrawal Time: 0 hours 7 minutes 0 seconds  Total Procedure Duration: 0 hours 12 minutes 25 seconds  Estimated Blood Loss:  Estimated blood loss: none.      [facility]

## 2021-11-21 NOTE — Anesthesia Postprocedure Evaluation (Signed)
Anesthesia Post Note  Patient: Wesley Butler  Procedure(s) Performed: COLONOSCOPY WITH PROPOFOL  Patient location during evaluation: PACU Anesthesia Type: General Level of consciousness: awake and alert Pain management: pain level controlled Vital Signs Assessment: post-procedure vital signs reviewed and stable Respiratory status: spontaneous breathing, nonlabored ventilation, respiratory function stable and patient connected to nasal cannula oxygen Cardiovascular status: blood pressure returned to baseline and stable Postop Assessment: no apparent nausea or vomiting Anesthetic complications: no   No notable events documented.   Last Vitals:  Vitals:   11/21/21 0850 11/21/21 0855  BP: (!) 83/47 (!) 84/49  Pulse: 80 76  Resp: 14 15  Temp: (!) 36.3 C (!) 36.3 C  SpO2: 99% 97%    Last Pain:  Vitals:   11/21/21 0855  TempSrc:   PainSc: 0-No pain                 Ilene Qua

## 2021-11-21 NOTE — Anesthesia Preprocedure Evaluation (Signed)
Anesthesia Evaluation  Patient identified by MRN, date of birth, ID band Patient awake    Reviewed: Allergy & Precautions, NPO status , Patient's Chart, lab work & pertinent test results  History of Anesthesia Complications Negative for: history of anesthetic complications  Airway Mallampati: II  TM Distance: >3 FB Neck ROM: full    Dental  (+) Upper Dentures, Lower Dentures   Pulmonary neg pulmonary ROS, former smoker   Pulmonary exam normal        Cardiovascular hypertension, On Medications + CAD  Normal cardiovascular exam     Neuro/Psych negative neurological ROS  negative psych ROS   GI/Hepatic negative GI ROS, Neg liver ROS,,,  Endo/Other  negative endocrine ROSdiabetes    Renal/GU negative Renal ROS  negative genitourinary   Musculoskeletal   Abdominal   Peds  Hematology negative hematology ROS (+)   Anesthesia Other Findings Past Medical History: No date: Diabetes mellitus without complication (H[facility]) No date: Hyperlipidemia No date: Hypertension No date: Wears dentures     Comment:  full upper and lower No date: Wears hearing aid in both ears     Comment:  Has, doesn't wear  Past Surgical History: 1999: CORONARY ARTERY BYPASS GRAFT     Comment:  3 vessel No date: HERNIA REPAIR  BMI    Body Mass Index: 20.18 kg/m      Reproductive/Obstetrics negative OB ROS                             Anesthesia Physical Anesthesia Plan  ASA: 2  Anesthesia Plan: General   Post-op Pain Management: Minimal or no pain anticipated   Induction: Intravenous  PONV Risk Score and Plan: Propofol infusion and TIVA  Airway Management Planned: Natural Airway and Nasal Cannula  Additional Equipment:   Intra-op Plan:   Post-operative Plan:   Informed Consent: I have reviewed the patients History and Physical, chart, labs and discussed the procedure including the risks, benefits and  alternatives for the proposed anesthesia with the patient or authorized representative who has indicated his/her understanding and acceptance.     Dental Advisory Given  Plan Discussed with: Anesthesiologist, CRNA and Surgeon  Anesthesia Plan Comments: (Patient consented for risks of anesthesia including but not limited to:  - adverse reactions to medications - risk of airway placement if required - damage to eyes, teeth, lips or other oral mucosa - nerve damage due to positioning  - sore throat or hoarseness - Damage to heart, brain, nerves, lungs, other parts of body or loss of life  Patient voiced understanding.)       Anesthesia Quick Evaluation

## 2021-11-21 NOTE — Anesthesia Postprocedure Evaluation (Signed)
Anesthesia Post Note  Patient: Wesley Butler  Procedure(s) Performed: COLONOSCOPY WITH PROPOFOL  Patient location during evaluation: PACU Anesthesia Type: General Level of consciousness: awake and alert Pain management: pain level controlled Vital Signs Assessment: post-procedure vital signs reviewed and stable Respiratory status: spontaneous breathing, nonlabored ventilation, respiratory function stable and patient connected to nasal cannula oxygen Cardiovascular status: blood pressure returned to baseline and stable Postop Assessment: no apparent nausea or vomiting Anesthetic complications: no   No notable events documented.   Last Vitals:  Vitals:   11/21/21 0900 11/21/21 0905  BP: (!) 85/66 (!) 94/56  Pulse: 76 64  Resp: 12 17  Temp:  (!) 36.3 C  SpO2: 97% 98%    Last Pain:  Vitals:   11/21/21 0905  TempSrc:   PainSc: 0-No pain                 Ilene Qua

## 2021-11-21 NOTE — Transfer of Care (Signed)
Immediate Anesthesia Transfer of Care Note  Patient: Wesley Butler  Procedure(s) Performed: COLONOSCOPY WITH PROPOFOL  Patient Location: PACU  Anesthesia Type: General  Level of Consciousness: awake, alert  and patient cooperative  Airway and Oxygen Therapy: Patient Spontanous Breathing and Patient connected to supplemental oxygen  Post-op Assessment: Post-op Vital signs reviewed, Patient's Cardiovascular Status Stable, Respiratory Function Stable, Patent Airway and No signs of Nausea or vomiting  Post-op Vital Signs: Reviewed and stable  Complications: No notable events documented.

## 2021-11-22 ENCOUNTER — Encounter: Payer: Self-pay | Admitting: Gastroenterology

## 2021-11-23 LAB — SURGICAL PATHOLOGY

## 2021-11-27 ENCOUNTER — Encounter: Payer: Self-pay | Admitting: Gastroenterology

## 2023-11-21 ENCOUNTER — Emergency Department

## 2023-11-21 ENCOUNTER — Emergency Department
Admission: EM | Admit: 2023-11-21 | Discharge: 2023-11-21 | Disposition: A | Discharge status: Home / Self Care | Attending: Emergency Medicine | Admitting: Emergency Medicine

## 2023-11-21 ENCOUNTER — Other Ambulatory Visit: Payer: Self-pay

## 2023-11-21 DIAGNOSIS — R0789 Other chest pain: Secondary | ICD-10-CM | POA: Diagnosis present

## 2023-11-21 DIAGNOSIS — E119 Type 2 diabetes mellitus without complications: Secondary | ICD-10-CM | POA: Insufficient documentation

## 2023-11-21 DIAGNOSIS — I10 Essential (primary) hypertension: Secondary | ICD-10-CM | POA: Insufficient documentation
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
## 2023-11-21 NOTE — ED Notes (Signed)
 Pt verbalizes understanding of discharge instructions. Opportunity for questioning and answers were provided. Pt declined reassessment of vital signs. Pt discharged from ED at this time. Pt ambulatory to lobby.

## 2023-11-21 NOTE — Discharge Instructions (Addendum)
 The CT scans of your head, neck and xray of your ribs did not show any acute abnormalities.   You can take 650 mg of Tylenol and 600 mg of ibuprofen every 6 hours as needed for pain. You can use ice, heat, muscle creams and other topical pain relievers as well.
